# Patient Record
Sex: Male | Born: 1990 | Hispanic: No
Health system: Southern US, Community
[De-identification: ages and names within clinical notes are randomized; demographics above are authoritative.]

---

## 2019-04-12 ENCOUNTER — Emergency Department (HOSPITAL_COMMUNITY)
Admission: EM | Admit: 2019-04-12 | Discharge: 2019-04-12 | Disposition: A | Discharge status: Home / Self Care | Payer: Self-pay | Attending: Emergency Medicine | Admitting: Emergency Medicine

## 2019-04-12 ENCOUNTER — Emergency Department (HOSPITAL_COMMUNITY): Payer: Self-pay

## 2019-04-12 ENCOUNTER — Other Ambulatory Visit: Payer: Self-pay

## 2019-04-12 ENCOUNTER — Encounter (HOSPITAL_COMMUNITY): Payer: Self-pay | Admitting: Emergency Medicine

## 2019-04-12 DIAGNOSIS — N201 Calculus of ureter: Secondary | ICD-10-CM | POA: Insufficient documentation

## 2019-04-12 LAB — COMPREHENSIVE METABOLIC PANEL
ALT: 24 U/L (ref 0–44)
AST: 21 U/L (ref 15–41)
Albumin: 4.2 g/dL (ref 3.5–5.0)
Alkaline Phosphatase: 101 U/L (ref 38–126)
Anion gap: 12 (ref 5–15)
BUN: 15 mg/dL (ref 6–20)
CO2: 25 mmol/L (ref 22–32)
Calcium: 9.9 mg/dL (ref 8.9–10.3)
Chloride: 103 mmol/L (ref 98–111)
Creatinine, Ser: 1 mg/dL (ref 0.61–1.24)
GFR calc Af Amer: >60 mL/min (ref >60)
GFR calc non Af Amer: >60 mL/min (ref >60)
Glucose, Bld: 133 mg/dL — ABNORMAL HIGH (ref 70–99)
Potassium: 4.1 mmol/L (ref 3.5–5.1)
Sodium: 140 mmol/L (ref 135–145)
Total Bilirubin: 0.8 mg/dL (ref 0.3–1.2)
Total Protein: 7.4 g/dL (ref 6.5–8.1)

## 2019-04-12 LAB — URINALYSIS, ROUTINE W REFLEX MICROSCOPIC
Bilirubin Urine: NEGATIVE
Glucose, UA: NEGATIVE mg/dL
Ketones, ur: NEGATIVE mg/dL
Leukocytes,Ua: NEGATIVE
Nitrite: NEGATIVE
Protein, ur: 30 mg/dL — AB
RBC / HPF: >50 RBC/hpf — ABNORMAL HIGH (ref 0–5)
Specific Gravity, Urine: 1.02 (ref 1.005–1.030)
pH: 8 (ref 5.0–8.0)

## 2019-04-12 LAB — CBC
HCT: 44.8 % (ref 39.0–52.0)
Hemoglobin: 14.6 g/dL (ref 13.0–17.0)
MCH: 29.4 pg (ref 26.0–34.0)
MCHC: 32.6 g/dL (ref 30.0–36.0)
MCV: 90.1 fL (ref 80.0–100.0)
Platelets: 230 10*3/uL (ref 150–400)
RBC: 4.97 MIL/uL (ref 4.22–5.81)
RDW: 11.9 % (ref 11.5–15.5)
WBC: 8 10*3/uL (ref 4.0–10.5)
nRBC: 0 % (ref 0.0–0.2)

## 2019-04-12 LAB — LIPASE, BLOOD: Lipase: 30 U/L (ref 11–51)

## 2019-04-12 MED ORDER — ONDANSETRON 4 MG PO TBDP: 4.0000 mg | ORAL_TABLET | Freq: Three times a day (TID) | ORAL | 0 refills | Status: AC | PRN

## 2019-04-12 MED ORDER — MORPHINE SULFATE (PF) 4 MG/ML IV SOLN
4.0000 mg | Freq: Once | INTRAVENOUS | Status: AC
  Administered 2019-04-12: 4 mg via INTRAVENOUS
  Filled 2019-04-12: qty 1

## 2019-04-12 MED ORDER — KETOROLAC TROMETHAMINE 15 MG/ML IJ SOLN
15.0000 mg | Freq: Once | INTRAMUSCULAR | Status: AC
  Administered 2019-04-12: 15 mg via INTRAVENOUS
  Filled 2019-04-12: qty 1

## 2019-04-12 MED ORDER — SODIUM CHLORIDE 0.9 % IV BOLUS
500.0000 mL | Freq: Once | INTRAVENOUS | Status: AC
  Administered 2019-04-12: 500 mL via INTRAVENOUS

## 2019-04-12 MED ORDER — SODIUM CHLORIDE 0.9% FLUSH
3.0000 mL | Freq: Once | INTRAVENOUS | Status: AC
  Administered 2019-04-12: 3 mL via INTRAVENOUS

## 2019-04-12 MED ORDER — ONDANSETRON HCL 4 MG/2ML IJ SOLN
4.0000 mg | Freq: Once | INTRAMUSCULAR | Status: AC
  Administered 2019-04-12: 4 mg via INTRAVENOUS
  Filled 2019-04-12: qty 2

## 2019-04-12 MED ORDER — ONDANSETRON HCL 4 MG PO TABS
4.0000 mg | ORAL_TABLET | Freq: Once | ORAL | Status: AC
  Administered 2019-04-12: 4 mg via ORAL
  Filled 2019-04-12: qty 1

## 2019-04-12 MED ORDER — HYDROCODONE-ACETAMINOPHEN 5-325 MG PO TABS: 1.0000 | ORAL_TABLET | ORAL | 0 refills | Status: AC | PRN

## 2019-04-12 NOTE — ED Triage Notes (Signed)
Pt reports left flank pain first started 2 weeks ago but resolved pt states woke up in severe pain and was unable to urinate. 9/10 pain

## 2019-04-12 NOTE — Discharge Instructions (Signed)
Home to rest, push hydrating fluids. Take Zofran as needed as prescribed for nausea and vomiting. Take Norco as prescribed for pain.  Do not drive or operate machinery if taking Norco. You can also take Motrin as needed as directed Follow-up with urology, return to ER for new or worsening symptoms especially worsening pain, fever, uncontrolled vomiting.

## 2019-04-12 NOTE — ED Provider Notes (Addendum)
[facility] EMERGENCY DEPARTMENT Provider Note   CSN: [id number] Arrival date & time: 04/12/19  1016     History Chief Complaint  Patient presents with  . Abdominal Pain    Hunter Taylor is a 29 y.o. male.  28yo male with past medical history of kidney stones, patient had pain 2 weeks ago that resolved. Pain returned today with left flank pain, vomiting, unable to void. Patient took Motrin without improvement.         History reviewed. No pertinent past medical history.  There are no problems to display for this patient.   No family history on file.  Social History   Tobacco Use  . Smoking status: Not on file  Substance Use Topics  . Alcohol use: Not on file  . Drug use: Not on file    Home Medications Prior to Admission medications   Medication Sig Start Date End Date Taking? Authorizing Provider  acetaminophen (TYLENOL) 325 MG tablet Take 650 mg by mouth every 6 (six) hours as needed for mild pain.   Yes [provider]  ibuprofen (ADVIL) 200 MG tablet Take 400 mg by mouth every 6 (six) hours as needed for mild pain.   Yes [provider]  HYDROcodone-acetaminophen (NORCO/VICODIN) 5-325 MG tablet Take 1 tablet by mouth every 4 (four) hours as needed. 04/12/19   Jeannie Fend, PA-C  ondansetron (ZOFRAN ODT) 4 MG disintegrating tablet Take 1 tablet (4 mg total) by mouth every 8 (eight) hours as needed for nausea or vomiting. 04/12/19   Jeannie Fend, PA-C    Allergies    Patient has no known allergies.  Review of Systems   Review of Systems  Constitutional: Negative for chills, diaphoresis and fever.  Gastrointestinal: Positive for nausea and vomiting. Negative for constipation and diarrhea.  Genitourinary: Positive for difficulty urinating and flank pain. Negative for dysuria, hematuria, scrotal swelling and testicular pain.  Musculoskeletal: Negative for back pain and myalgias.  Skin: Negative for rash and wound.    Allergic/Immunologic: Negative for immunocompromised state.  Neurological: Negative for weakness.  Hematological: Does not bruise/bleed easily.  Psychiatric/Behavioral: Negative for confusion.  All other systems reviewed and are negative.   Physical Exam Updated Vital Signs BP 118/78   Pulse 82   Temp 98.3 F (36.8 C) (Oral)   Resp 15   SpO2 100%   Physical Exam Vitals and nursing note reviewed.  Constitutional:      General: He is not in acute distress.    Appearance: He is well-developed. He is not diaphoretic.     Comments: Appears to feel unwell  HENT:     Head: Normocephalic and atraumatic.  Cardiovascular:     Rate and Rhythm: Normal rate and regular rhythm.     Heart sounds: Normal heart sounds.  Pulmonary:     Effort: Pulmonary effort is normal.     Breath sounds: Normal breath sounds.  Abdominal:     Palpations: Abdomen is soft.     Tenderness: There is no abdominal tenderness. There is no right CVA tenderness or left CVA tenderness.  Skin:    General: Skin is warm and dry.     Findings: No rash.  Neurological:     Mental Status: He is alert and oriented to person, place, and time.  Psychiatric:        Behavior: Behavior normal.     ED Results / Procedures / Treatments   Labs (all labs ordered are listed, but only  abnormal results are displayed) Labs Reviewed  COMPREHENSIVE METABOLIC PANEL - Abnormal; Notable for the following components:      Result Value   Glucose, Bld 133 (*)    All other components within normal limits  URINALYSIS, ROUTINE W REFLEX MICROSCOPIC - Abnormal; Notable for the following components:   APPearance CLOUDY (*)    Hgb urine dipstick LARGE (*)    Protein, ur 30 (*)    RBC / HPF >50 (*)    Bacteria, UA RARE (*)    All other components within normal limits  LIPASE, BLOOD  CBC    EKG None  Radiology CT Renal Stone Study  Result Date: 04/12/2019 CLINICAL DATA:  Left-sided flank pain for 2 weeks EXAM: CT ABDOMEN AND  PELVIS WITHOUT CONTRAST TECHNIQUE: Multidetector CT imaging of the abdomen and pelvis was performed following the standard protocol without IV contrast. COMPARISON:  None. FINDINGS: Lower chest: No acute abnormality. Hepatobiliary: No focal liver abnormality is seen. No gallstones, gallbladder wall thickening, or biliary dilatation. Pancreas: Unremarkable. No pancreatic ductal dilatation or surrounding inflammatory changes. Spleen: Choose Adrenals/Urinary Tract: Adrenal glands are within normal limits. The right kidney is unremarkable. Left kidney demonstrates mild hydronephrosis and hydroureter which extends to the level of the ureterovesical junction. A small 2-3 mm stone is noted at the left UVJ. The bladder is partially distended. Stomach/Bowel: The appendix is within normal limits. No obstructive or inflammatory changes of large or small bowel are seen. The stomach is unremarkable. Vascular/Lymphatic: No significant vascular findings are present. No enlarged abdominal or pelvic lymph nodes. Reproductive: Prostate is unremarkable. Other: No abdominal wall hernia or abnormality. No abdominopelvic ascites. Musculoskeletal: No acute or significant osseous findings. IMPRESSION: 2-3 mm left UVJ stone with mild obstructive change. Electronically Signed   By: Alcide Clever M.D.   On: 04/12/2019 11:55    Procedures Procedures (including critical care time)  Medications Ordered in ED Medications  sodium chloride flush (NS) 0.9 % injection 3 mL (3 mLs Intravenous Given 04/12/19 1226)  morphine 4 MG/ML injection 4 mg (4 mg Intravenous Given 04/12/19 1122)  ondansetron (ZOFRAN) injection 4 mg (4 mg Intravenous Given 04/12/19 1122)  sodium chloride 0.9 % bolus 500 mL (0 mLs Intravenous Stopped 04/12/19 1226)  ketorolac (TORADOL) 15 MG/ML injection 15 mg (15 mg Intravenous Given 04/12/19 1226)    ED Course  I have reviewed the triage vital signs and the nursing notes.  Pertinent labs & imaging results that were  available during my care of the patient were reviewed by me and considered in my medical decision making (see chart for details).  Clinical Course as of Apr 11 1344  Mon Apr 12, 2019  1227 28yo with prior kidney stones presents with left flank pain. CT with 2-35mm distal left UVJ stone. Normal CR, CBC WNL. Plan is to give Toradol for better pain control, check UA, dc if pain better controlled.   [LM]  1325 Pain has resolved, patient is ready for discharge.  Urine without evidence of infection.  Prescriptions for Norco and Zofran sent to patient's pharmacy with plan for follow-up with urology, return to ER precautions given.   [LM]    Clinical Course User Index [LM] Alden Hipp   MDM Rules/Calculators/A&P                      Final Clinical Impression(s) / ED Diagnoses Final diagnoses:  Ureterolithiasis    Rx / DC Orders ED Discharge Orders  Ordered    HYDROcodone-acetaminophen (NORCO/VICODIN) 5-325 MG tablet  Every 4 hours PRN     04/12/19 1324    ondansetron (ZOFRAN ODT) 4 MG disintegrating tablet  Every 8 hours PRN     04/12/19 1324           Tacy Learn, PA-C 04/12/19 1326    Tacy Learn, PA-C 04/12/19 1346    Percell Miller Hewitt Shorts, PA-C 04/12/19 1347    Malvin Johns, MD 04/13/19 [phone]

## 2020-06-18 IMAGING — CT CT RENAL STONE PROTOCOL
2 of 4 series · 16 of 46 positions shown, 18 images · non-contrast
Comparison: None.

CLINICAL DATA: Left-sided flank pain for 2 weeks

EXAM:
CT ABDOMEN AND PELVIS WITHOUT CONTRAST
TECHNIQUE: Multidetector CT imaging of the abdomen and pelvis was performed
following the standard protocol without IV contrast.

[Series 3: renal stone 5.0 · axial · 0.64mm/px · z∈[-465,-60]mm · 13 of 89 slices shown, 15 images]
[im 4/89  soft-tissue]
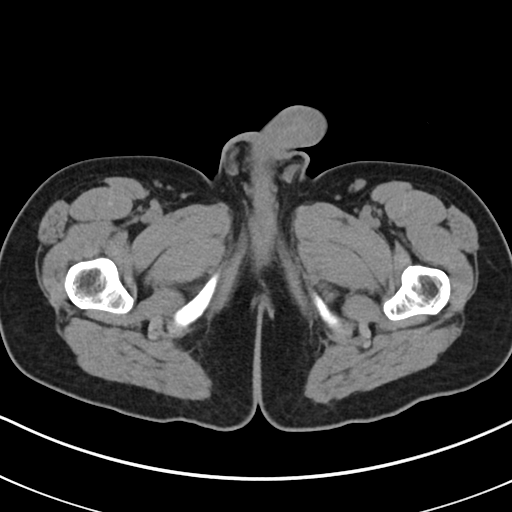
[im 4/89  bone]
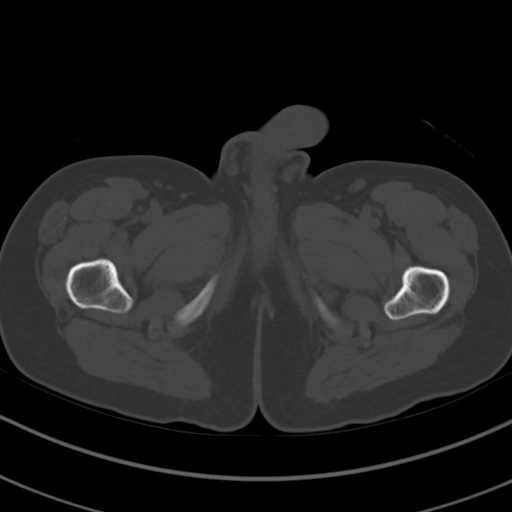
[im 12/89  soft-tissue]
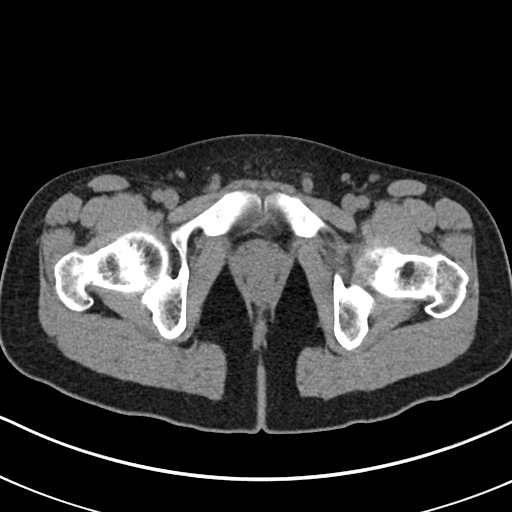
[im 19/89  soft-tissue]
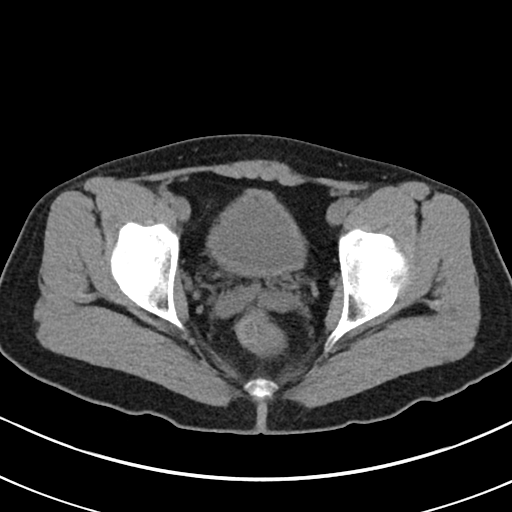
[im 26/89  soft-tissue]
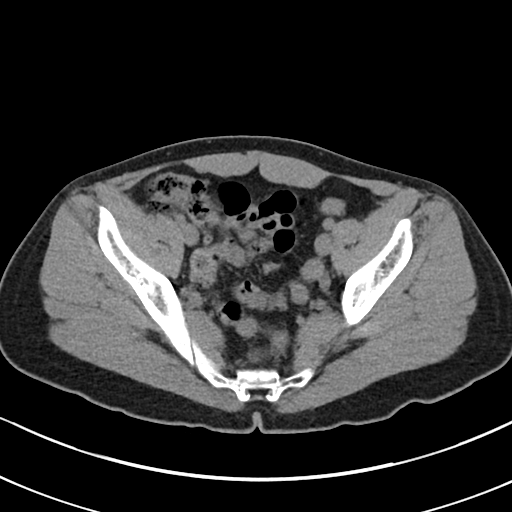
[im 30/89  soft-tissue]
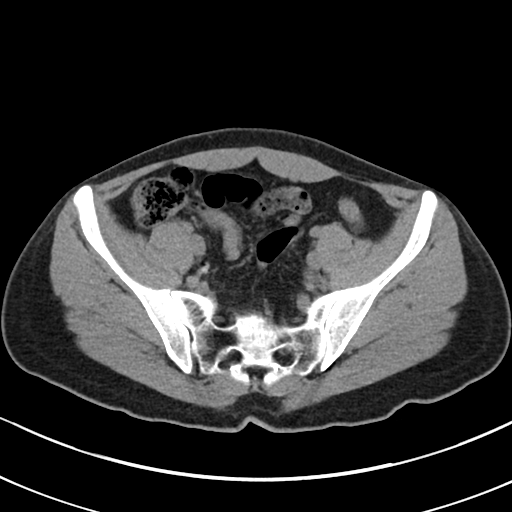
[im 37/89  soft-tissue]
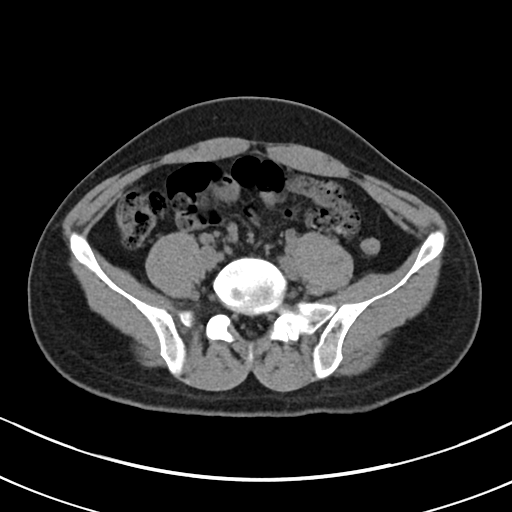
[im 45/89  soft-tissue]
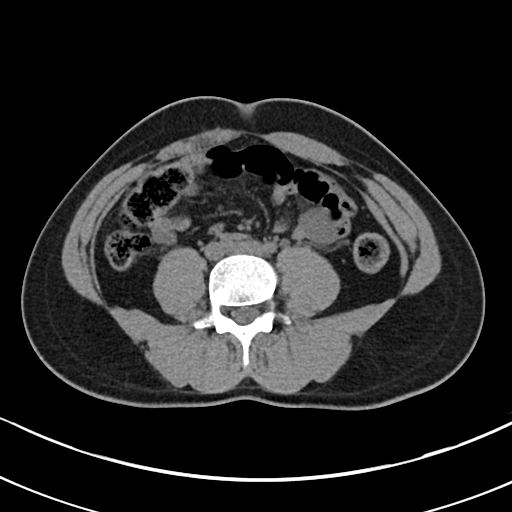
[im 52/89  soft-tissue]
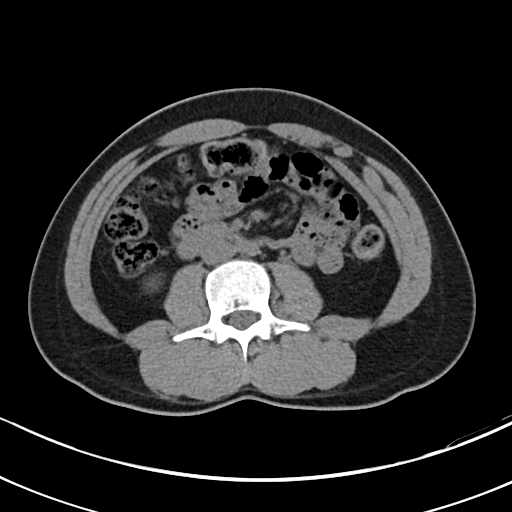
[im 59/89  soft-tissue]
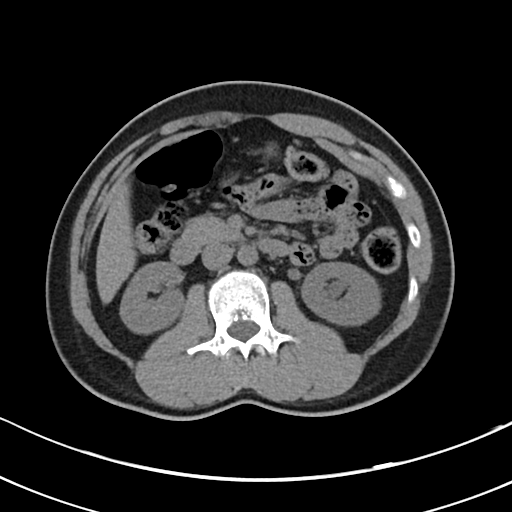
[im 59/89  bone]
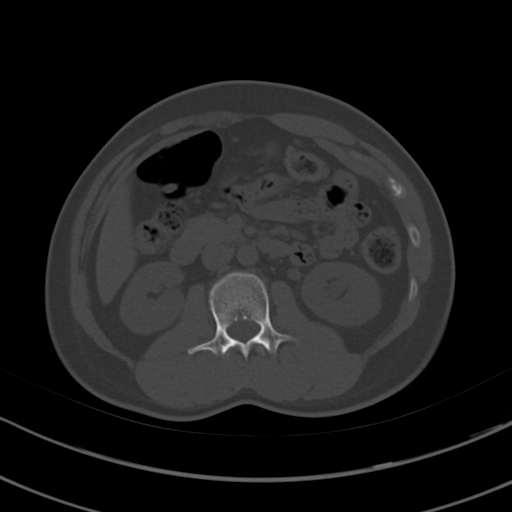
[im 63/89  soft-tissue]
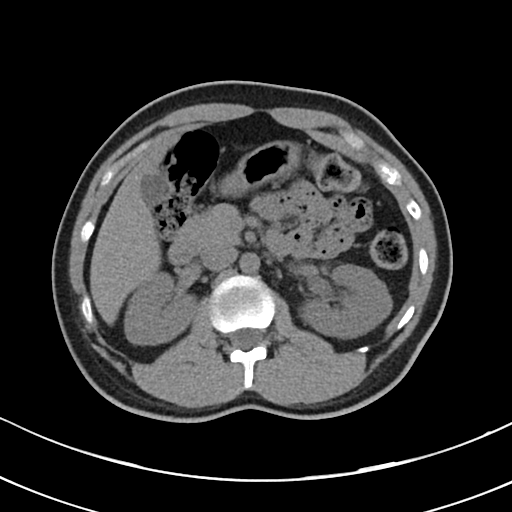
[im 70/89  soft-tissue]
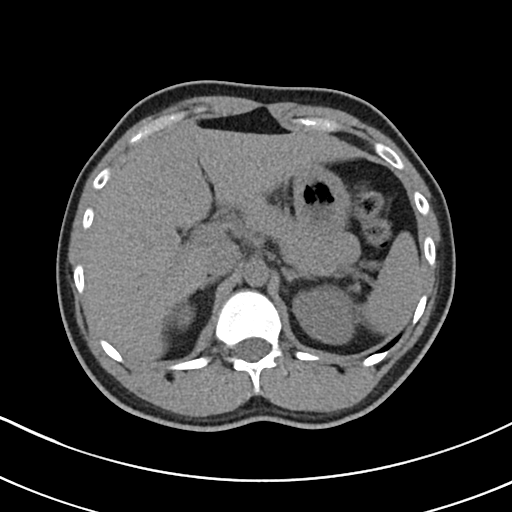
[im 78/89  soft-tissue]
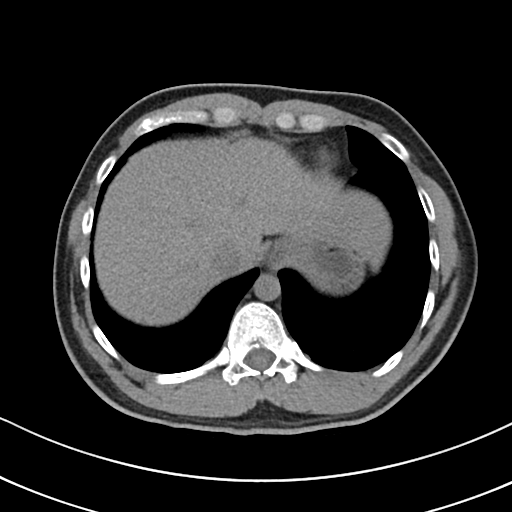
[im 85/89  soft-tissue]
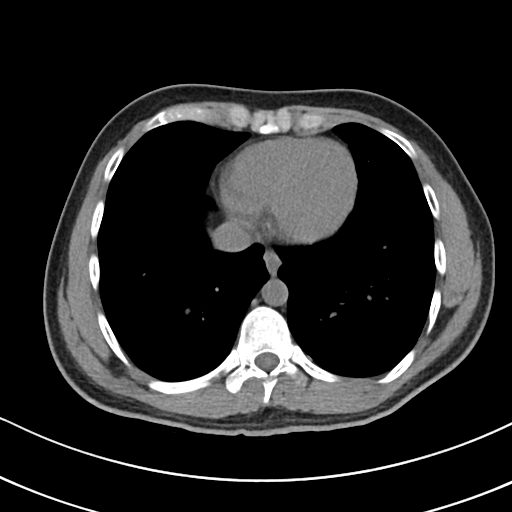

[Series 5: renal stone 3.0 cor · coronal · 0.61mm/px · 3 of 75 slices shown]
[im 25/75  soft-tissue]
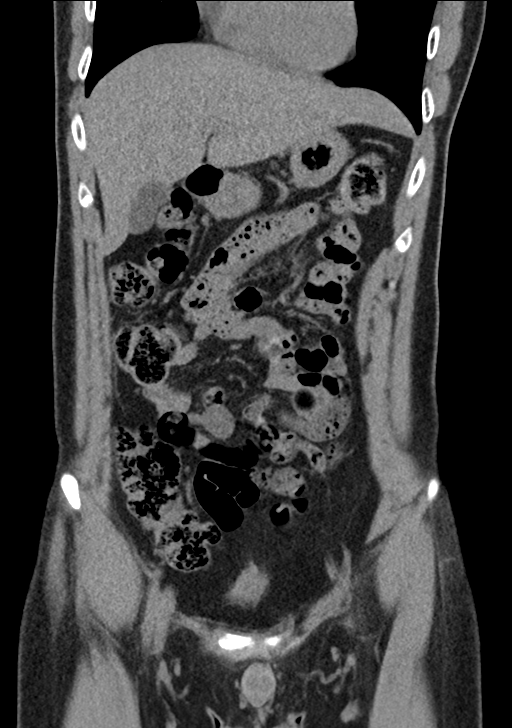
[im 33/75  soft-tissue]
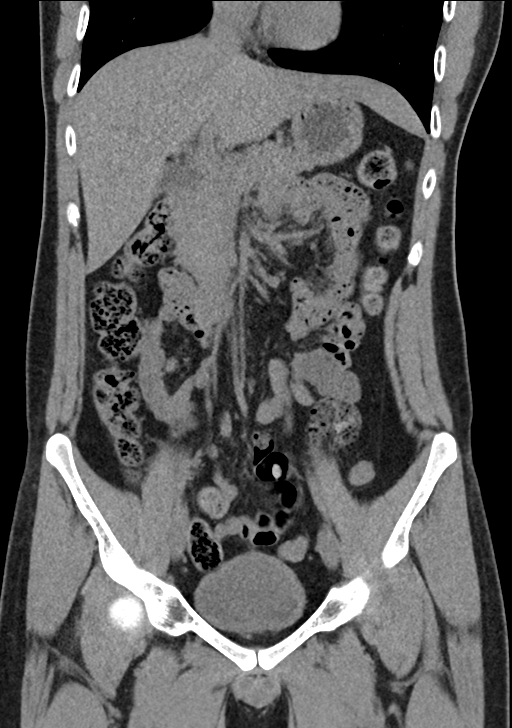
[im 42/75  soft-tissue]
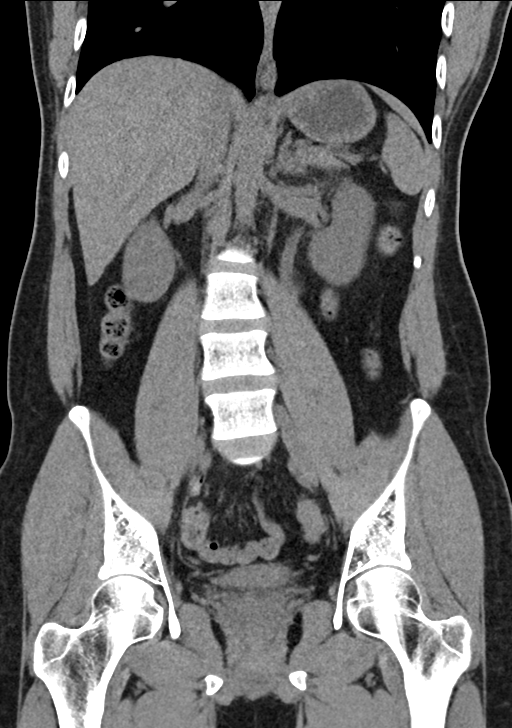

[16 of 46 positions shown; findings below may reference images not displayed]

FINDINGS: Lower chest: No acute abnormality.

Hepatobiliary: No focal liver abnormality is seen. No gallstones,
gallbladder wall thickening, or biliary dilatation.

Pancreas: Unremarkable. No pancreatic ductal dilatation or
surrounding inflammatory changes.

Spleen: Choose

Adrenals/Urinary Tract: Adrenal glands are within normal limits. The
right kidney is unremarkable. Left kidney demonstrates mild
hydronephrosis and hydroureter which extends to the level of the
ureterovesical junction. A small 2-3 mm stone is noted at the left
UVJ. The bladder is partially distended.

Stomach/Bowel: The appendix is within normal limits. No obstructive
or inflammatory changes of large or small bowel are seen. The
stomach is unremarkable.

Vascular/Lymphatic: No significant vascular findings are present. No
enlarged abdominal or pelvic lymph nodes.

Reproductive: Prostate is unremarkable.

Other: No abdominal wall hernia or abnormality. No abdominopelvic
ascites.

Musculoskeletal: No acute or significant osseous findings.
IMPRESSION: 2-3 mm left UVJ stone with mild obstructive change.
